# Patient Record
Sex: Male | Born: 1945 | Race: Black or African American | Hispanic: No | Marital: Single | Smoking: Former smoker
Health system: Southern US, Community
[De-identification: ages and names within clinical notes are randomized; demographics above are authoritative.]

## PROBLEM LIST (undated history)

## (undated) DIAGNOSIS — H3320 Serous retinal detachment, unspecified eye: Secondary | ICD-10-CM

## (undated) DIAGNOSIS — N289 Disorder of kidney and ureter, unspecified: Secondary | ICD-10-CM

## (undated) DIAGNOSIS — E119 Type 2 diabetes mellitus without complications: Secondary | ICD-10-CM

## (undated) HISTORY — PX: FOOT AMPUTATION: SHX951

---

## 2013-09-29 ENCOUNTER — Non-Acute Institutional Stay (SKILLED_NURSING_FACILITY): Payer: PRIVATE HEALTH INSURANCE | Admitting: Family

## 2013-09-29 DIAGNOSIS — IMO0002 Reserved for concepts with insufficient information to code with codable children: Secondary | ICD-10-CM

## 2013-09-29 DIAGNOSIS — E1165 Type 2 diabetes mellitus with hyperglycemia: Secondary | ICD-10-CM

## 2013-09-29 DIAGNOSIS — N186 End stage renal disease: Secondary | ICD-10-CM

## 2013-09-29 DIAGNOSIS — E118 Type 2 diabetes mellitus with unspecified complications: Secondary | ICD-10-CM

## 2013-09-29 DIAGNOSIS — Z992 Dependence on renal dialysis: Principal | ICD-10-CM

## 2013-09-29 DIAGNOSIS — M109 Gout, unspecified: Secondary | ICD-10-CM

## 2013-10-03 ENCOUNTER — Emergency Department (HOSPITAL_COMMUNITY): Payer: Medicare Other

## 2013-10-03 ENCOUNTER — Encounter (HOSPITAL_COMMUNITY): Payer: Self-pay | Admitting: Emergency Medicine

## 2013-10-03 ENCOUNTER — Emergency Department (HOSPITAL_COMMUNITY)
Admission: EM | Admit: 2013-10-03 | Discharge: 2013-10-03 | Disposition: A | Discharge status: Skilled Nursing Facility | Payer: Medicare Other | Attending: Emergency Medicine | Admitting: Emergency Medicine

## 2013-10-03 DIAGNOSIS — Y92129 Unspecified place in nursing home as the place of occurrence of the external cause: Secondary | ICD-10-CM

## 2013-10-03 DIAGNOSIS — N186 End stage renal disease: Secondary | ICD-10-CM | POA: Diagnosis not present

## 2013-10-03 DIAGNOSIS — W06XXXA Fall from bed, initial encounter: Secondary | ICD-10-CM | POA: Insufficient documentation

## 2013-10-03 DIAGNOSIS — S98919A Complete traumatic amputation of unspecified foot, level unspecified, initial encounter: Secondary | ICD-10-CM | POA: Diagnosis not present

## 2013-10-03 DIAGNOSIS — Z87891 Personal history of nicotine dependence: Secondary | ICD-10-CM | POA: Insufficient documentation

## 2013-10-03 DIAGNOSIS — W19XXXA Unspecified fall, initial encounter: Secondary | ICD-10-CM

## 2013-10-03 DIAGNOSIS — I959 Hypotension, unspecified: Secondary | ICD-10-CM | POA: Diagnosis not present

## 2013-10-03 DIAGNOSIS — S0990XA Unspecified injury of head, initial encounter: Secondary | ICD-10-CM | POA: Insufficient documentation

## 2013-10-03 DIAGNOSIS — Z880 Allergy status to penicillin: Secondary | ICD-10-CM | POA: Insufficient documentation

## 2013-10-03 DIAGNOSIS — Z7901 Long term (current) use of anticoagulants: Secondary | ICD-10-CM | POA: Diagnosis not present

## 2013-10-03 DIAGNOSIS — Y9389 Activity, other specified: Secondary | ICD-10-CM | POA: Insufficient documentation

## 2013-10-03 DIAGNOSIS — Y9210 Unspecified residential institution as the place of occurrence of the external cause: Secondary | ICD-10-CM | POA: Insufficient documentation

## 2013-10-03 DIAGNOSIS — E119 Type 2 diabetes mellitus without complications: Secondary | ICD-10-CM | POA: Diagnosis not present

## 2013-10-03 HISTORY — DX: Disorder of kidney and ureter, unspecified: N28.9

## 2013-10-03 HISTORY — DX: Serous retinal detachment, unspecified eye: H33.20

## 2013-10-03 HISTORY — DX: Type 2 diabetes mellitus without complications: E11.9

## 2013-10-03 LAB — BASIC METABOLIC PANEL
BUN: 59 mg/dL — ABNORMAL HIGH (ref 6–23)
CO2: 24 meq/L (ref 19–32)
CREATININE: 11.01 mg/dL — AB (ref 0.50–1.35)
Calcium: 7.3 mg/dL — ABNORMAL LOW (ref 8.4–10.5)
Chloride: 95 mEq/L — ABNORMAL LOW (ref 96–112)
GFR calc Af Amer: 5 mL/min — ABNORMAL LOW (ref >90)
GFR, EST NON AFRICAN AMERICAN: 4 mL/min — AB (ref >90)
GLUCOSE: 157 mg/dL — AB (ref 70–99)
Potassium: 3.4 mEq/L — ABNORMAL LOW (ref 3.7–5.3)
Sodium: 141 mEq/L (ref 137–147)

## 2013-10-03 LAB — CBC WITH DIFFERENTIAL/PLATELET
BASOS ABS: 0 10*3/uL (ref 0.0–0.1)
Basophils Relative: 0 % (ref 0–1)
EOS ABS: 0 10*3/uL (ref 0.0–0.7)
EOS PCT: 0 % (ref 0–5)
HEMATOCRIT: 24.8 % — AB (ref 39.0–52.0)
Hemoglobin: 8.2 g/dL — ABNORMAL LOW (ref 13.0–17.0)
LYMPHS PCT: 14 % (ref 12–46)
Lymphs Abs: 1.2 10*3/uL (ref 0.7–4.0)
MCH: 30.6 pg (ref 26.0–34.0)
MCHC: 33.1 g/dL (ref 30.0–36.0)
MCV: 92.5 fL (ref 78.0–100.0)
MONO ABS: 0.7 10*3/uL (ref 0.1–1.0)
Monocytes Relative: 8 % (ref 3–12)
Neutro Abs: 6.6 10*3/uL (ref 1.7–7.7)
Neutrophils Relative %: 78 % — ABNORMAL HIGH (ref 43–77)
Platelets: 320 10*3/uL (ref 150–400)
RBC: 2.68 MIL/uL — ABNORMAL LOW (ref 4.22–5.81)
RDW: 15.3 % (ref 11.5–15.5)
WBC: 8.5 10*3/uL (ref 4.0–10.5)

## 2013-10-03 LAB — PROTIME-INR
INR: 2.83 — ABNORMAL HIGH (ref 0.00–1.49)
Prothrombin Time: 28.8 seconds — ABNORMAL HIGH (ref 11.6–15.2)

## 2013-10-03 LAB — CG4 I-STAT (LACTIC ACID): LACTIC ACID, VENOUS: 1.05 mmol/L (ref 0.5–2.2)

## 2013-10-03 LAB — TROPONIN I

## 2013-10-03 MED ORDER — SODIUM CHLORIDE 0.9 % IV SOLN: 1000.0000 mL | INTRAVENOUS | Status: DC

## 2013-10-03 MED ORDER — SODIUM CHLORIDE 0.9 % IV SOLN
1000.0000 mL | Freq: Once | INTRAVENOUS | Status: AC
  Administered 2013-10-03: 1000 mL via INTRAVENOUS

## 2013-10-03 NOTE — ED Notes (Signed)
[facility] transport called for pt transport.

## 2013-10-03 NOTE — ED Notes (Signed)
Per [facility], pt was [facility] when he tried to get up to move the head of his bed up when he fell approximately 1' on to a tile floor and hit his head.  Pt c/o of headache and chronic Left foot pain (boot placed prior to ED visit).  Pt denies N/V/D, mild dizziness.  Pt shows no visible abrasions to the head from fall.  EMS vitals 96/60, 88 pulse, 16 respirations and 174 CBG.  Pt is on Coumadin.

## 2013-10-03 NOTE — ED Notes (Signed)
PT ambulated with baseline gait; VSS; A&Ox3; no signs of distress; respirations even and unlabored; skin warm and dry; no questions upon discharge.  

## 2013-10-03 NOTE — Discharge Instructions (Signed)
Because you were taking warfarin, you need to be watched for signs of delayed bleeding. If any neurologic problems occur, you need to return to the ED immediately for repeat CT scan.  Fall Prevention and Home Safety Falls cause injuries and can affect all age groups. It is possible to use preventive measures to significantly decrease the likelihood of falls. There are many simple measures which can make your home safer and prevent falls. OUTDOORS  Repair cracks and edges of walkways and driveways.  Remove high doorway thresholds.  Trim shrubbery on the main path into your home.  Have good outside lighting.  Clear walkways of tools, rocks, debris, and clutter.  Check that handrails are not broken and are securely fastened. Both sides of steps should have handrails.  Have leaves, snow, and ice cleared regularly.  Use sand or salt on walkways during winter months.  In the garage, clean up grease or oil spills. BATHROOM  Install night lights.  Install grab bars by the toilet and in the tub and shower.  Use non-skid mats or decals in the tub or shower.  Place a plastic non-slip stool in the shower to sit on, if needed.  Keep floors dry and clean up all water on the floor immediately.  Remove soap buildup in the tub or shower on a regular basis.  Secure bath mats with non-slip, double-sided rug tape.  Remove throw rugs and tripping hazards from the floors. BEDROOMS  Install night lights.  Make sure a bedside light is easy to reach.  Do not use oversized bedding.  Keep a telephone by your bedside.  Have a firm chair with side arms to use for getting dressed.  Remove throw rugs and tripping hazards from the floor. KITCHEN  Keep handles on pots and pans turned toward the center of the stove. Use back burners when possible.  Clean up spills quickly and allow time for drying.  Avoid walking on wet floors.  Avoid hot utensils and knives.  Position shelves so they  are not too high or low.  Place commonly used objects within easy reach.  If necessary, use a sturdy step stool with a grab bar when reaching.  Keep electrical cables out of the way.  Do not use floor polish or wax that makes floors slippery. If you must use wax, use non-skid floor wax.  Remove throw rugs and tripping hazards from the floor. STAIRWAYS  Never leave objects on stairs.  Place handrails on both sides of stairways and use them. Fix any loose handrails. Make sure handrails on both sides of the stairways are as long as the stairs.  Check carpeting to make sure it is firmly attached along stairs. Make repairs to worn or loose carpet promptly.  Avoid placing throw rugs at the top or bottom of stairways, or properly secure the rug with carpet tape to prevent slippage. Get rid of throw rugs, if possible.  Have an electrician put in a light switch at the top and bottom of the stairs. OTHER FALL PREVENTION TIPS  Wear low-heel or rubber-soled shoes that are supportive and fit well. Wear closed toe shoes.  When using a stepladder, make sure it is fully opened and both spreaders are firmly locked. Do not climb a closed stepladder.  Add color or contrast paint or tape to grab bars and handrails in your home. Place contrasting color strips on first and last steps.  Learn and use mobility aids as needed. Install an electrical emergency response system.  Turn on lights to avoid dark areas. Replace light bulbs that burn out immediately. Get light switches that glow.  Arrange furniture to create clear pathways. Keep furniture in the same place.  Firmly attach carpet with non-skid or double-sided tape.  Eliminate uneven floor surfaces.  Select a carpet pattern that does not visually hide the edge of steps.  Be aware of all pets. OTHER HOME SAFETY TIPS  Set the water temperature for 120 F (48.8 C).  Keep emergency numbers on or near the telephone.  Keep smoke detectors on  every level of the home and near sleeping areas. Document Released: 09/01/2002 Document Revised: 03/12/2012 Document Reviewed: 12/01/2011 [facility] Patient Information 2014 [place], [place].

## 2013-10-03 NOTE — ED Notes (Signed)
CT paged, pt is ready for transport.

## 2013-10-03 NOTE — ED Notes (Signed)
Pt's brief changed before departure. Report given to [facility].

## 2013-10-03 NOTE — ED Notes (Signed)
Phlebotomy at bedside drawing labs.

## 2013-10-03 NOTE — ED Notes (Signed)
[facility]; given to BerkleyJennifer. No questions.

## 2013-10-03 NOTE — ED Notes (Signed)
Pt has an amputation on the Right foot to the ankle.  Pt has a boot on his Left foot, pt states "waiting for autoamputation of his toe".  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  Right pupil is a 2 with brisk response.

## 2013-10-03 NOTE — ED Provider Notes (Signed)
CSN: [id number]     Arrival date & time 10/03/13  0415 History   First MD Initiated Contact with Patient 10/03/13 0435     Chief Complaint  Patient presents with  . Fall  . Headache   (Consider location/radiation/quality/duration/timing/severity/associated sxs/prior Treatment) Patient is a 68 y.o. male presenting with fall and headaches. The history is provided by the EMS personnel, the nursing home and the patient.  Fall Associated symptoms include headaches.  Headache He states that he was leaning over to adjust the head in his bed, and the next thing he knew he was on the floor. He nursing home relates that he has had periods complaining of pain in the frontal area. Pain is moderate and he rates it at 4/10. He denies loss of consciousness. There's been no nausea or vomiting. He is on warfarin.  Past Medical History  Diagnosis Date  . Detached retina     Left eye  . Renal disorder   . Diabetes mellitus without complication    Past Surgical History  Procedure Laterality Date  . Foot amputation      Right Foot   History reviewed. No pertinent family history. History  Substance Use Topics  . Smoking status: Former Games developer  . Smokeless tobacco: Never Used  . Alcohol Use: No    Review of Systems  Neurological: Positive for headaches.  All other systems reviewed and are negative.    Allergies  Penicillins  Home Medications  No current outpatient prescriptions on file. BP 100/55  Pulse 81  Temp(Src) 98.2 F (36.8 C) (Oral)  Ht 6' (1.829 m)  Wt 173 lb (78.472 kg)  BMI 23.46 kg/m2  SpO2 95% Physical Exam  Nursing note and vitals reviewed.  68 year old male, resting comfortably and in no acute distress. Vital signs are normal. Oxygen saturation is 95%, which is normal. Head is normocephalic and atraumatic. Right pupil responds to light, left pupil does not. EOMI. Oropharynx is clear. Neck is nontender and supple without adenopathy or JVD. Back is nontender and there  is no CVA tenderness. Lungs are clear without rales, wheezes, or rhonchi. Chest is nontender. Heart has regular rate and rhythm without murmur. Abdomen is soft, flat, nontender without masses or hepatosplenomegaly and peristalsis is normoactive. Extremities have no cyanosis or edema. 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 Left foot is in a boot and is not examined. Skin is warm and dry without rash. Neurologic: Mental status is normal, cranial nerves are intact, there are no motor or sensory deficits.  ED Course  Procedures (including critical care time) Labs Review Results for orders placed during the hospital encounter of 10/03/13  PROTIME-INR      Result Value Range   Prothrombin Time 28.8 (*) 11.6 - 15.2 seconds   INR 2.83 (*) 0.00 - 1.49  CBC WITH DIFFERENTIAL      Result Value Range   WBC 8.5  4.0 - 10.5 K/uL   RBC 2.68 (*) 4.22 - 5.81 MIL/uL   Hemoglobin 8.2 (*) 13.0 - 17.0 g/dL   HCT 78.2 (*) 95.6 - 21.3 %   MCV 92.5  78.0 - 100.0 fL   MCH 30.6  26.0 - 34.0 pg   MCHC 33.1  30.0 - 36.0 g/dL   RDW 08.6  57.8 - 46.9 %   Platelets 320  150 - 400 K/uL   Neutrophils Relative % 78 (*) 43 - 77 %   Neutro Abs 6.6  1.7 - 7.7 K/uL   Lymphocytes Relative  14  12 - 46 %   Lymphs Abs 1.2  0.7 - 4.0 K/uL   Monocytes Relative 8  3 - 12 %   Monocytes Absolute 0.7  0.1 - 1.0 K/uL   Eosinophils Relative 0  0 - 5 %   Eosinophils Absolute 0.0  0.0 - 0.7 K/uL   Basophils Relative 0  0 - 1 %   Basophils Absolute 0.0  0.0 - 0.1 K/uL  BASIC METABOLIC PANEL      Result Value Range   Sodium 141  137 - 147 mEq/L   Potassium 3.4 (*) 3.7 - 5.3 mEq/L   Chloride 95 (*) 96 - 112 mEq/L   CO2 24  19 - 32 mEq/L   Glucose, Bld 157 (*) 70 - 99 mg/dL   BUN 59 (*) 6 - 23 mg/dL   Creatinine, Ser 16.1011.01 (*) 0.50 - 1.35 mg/dL   Calcium 7.3 (*) 8.4 - 10.5 mg/dL   GFR calc non Af Amer 4 (*) >90 mL/min   GFR calc Af Amer 5 (*) >90 mL/min  TROPONIN I      Result Value Range   Troponin I <0.30  <0.30  ng/mL  CG4 I-STAT (LACTIC ACID)      Result Value Range   Lactic Acid, Venous 1.05  0.5 - 2.2 mmol/L   Imaging Review Ct Head Wo Contrast  10/03/2013   CLINICAL DATA:  Fall with headache  EXAM: CT HEAD WITHOUT CONTRAST  CT CERVICAL SPINE WITHOUT CONTRAST  TECHNIQUE: Multidetector CT imaging of the head and cervical spine was performed following the standard protocol without intravenous contrast. Multiplanar CT image reconstructions of the cervical spine were also generated.  COMPARISON:  None.  FINDINGS: CT HEAD FINDINGS  Skull and Sinuses:No significant abnormality.  Orbits: High attenuation centrally within the left globe, most compatible with silicone injection in this patient with history of retinal detachment.  Brain: No evidence of acute abnormality, such as acute infarction, hemorrhage, hydrocephalus, or mass lesion/mass effect. There is dystrophic mineralization in the globus pallidum. Linear mineralization, likely vascular, in the left centrum semiovale. These dystrophic calcifications are fairly extensive, and when considering the extensive extracranial arterial calcification, findings are likely related to the patient's reported renal disease. Cerebral volume loss.  CT CERVICAL SPINE FINDINGS  Status post C4-C7 anterior cervical discectomy and fusion. There is also corpectomy at C6. There is complete interbody fusion. There is no adverse features related to the ventral plate and screw. There is been a posterior rod and lateral mass/pedicle screw fixation from C3-T1. No screws placed at C7. Decompressive laminectomies were performed at C2-3 to C6-7.  No evidence of acute fracture. No hardware fracture. No prevertebral edema or gross cervical canal hematoma. Evaluation of the cervical canal is especially limited in the setting of hardware artifact. 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  15 mm long nodule in the right tracheoesophageal groove, contacting but not definitively arising from the right thyroid gland. There is enlargement of the lower right thyroid, likely by a sub cm nodule.  IMPRESSION: 1. No evidence of acute intracranial or cervical spine injury. 2. Silicone tamponade in the left eye. Please confirm with surgical history. 3. Extensive cervical surgery, with anterior discectomies/corpectomy and posterior decompressive laminectomies. 4. 15 mm nodule in the right tracheoesophageal groove which could be parathyroid mass or hyperplasia. Recommend outpatient laboratory assessment of  calcium metabolism.   Electronically Signed   By: Tiburcio Pea M.D.   On: 10/03/2013 05:52   Ct Cervical Spine Wo Contrast  10/03/2013   CLINICAL DATA:  Fall with headache  EXAM: CT HEAD WITHOUT CONTRAST  CT CERVICAL SPINE WITHOUT CONTRAST  TECHNIQUE: Multidetector CT imaging of the head and cervical spine was performed following the standard protocol without intravenous contrast. Multiplanar CT image reconstructions of the cervical spine were also generated.  COMPARISON:  None.  FINDINGS: CT HEAD FINDINGS  Skull and Sinuses:No significant abnormality.  Orbits: High attenuation centrally within the left globe, most compatible with silicone injection in this patient with history of retinal detachment.  Brain: No evidence of acute abnormality, such as acute infarction, hemorrhage, hydrocephalus, or mass lesion/mass effect. There is dystrophic mineralization in the globus pallidum. Linear mineralization, likely vascular, in the left centrum semiovale. These dystrophic calcifications are fairly extensive, and when considering the extensive extracranial arterial calcification, findings are likely related to the patient's reported renal disease. Cerebral volume loss.  CT CERVICAL SPINE FINDINGS  Status post C4-C7 anterior cervical discectomy and  fusion. There is also corpectomy at C6. There is complete interbody fusion. There is no adverse features related to the ventral plate and screw. There is been a posterior rod and lateral mass/pedicle screw fixation from C3-T1. No screws placed at C7. Decompressive laminectomies were performed at C2-3 to C6-7.  No evidence of acute fracture. No hardware fracture. No prevertebral edema or gross cervical canal hematoma. Evaluation of the cervical canal is especially limited in the setting of hardware artifact. 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  15 mm long nodule in the right tracheoesophageal groove, contacting but not definitively arising from the right thyroid gland. There is enlargement of the lower right thyroid, likely by a sub cm nodule.  IMPRESSION: 1. No evidence of acute intracranial or cervical spine injury. 2. Silicone tamponade in the left eye. Please confirm with surgical history. 3. Extensive cervical surgery, with anterior discectomies/corpectomy and posterior decompressive laminectomies. 4. 15 mm nodule in the right tracheoesophageal groove which could be parathyroid mass or hyperplasia. Recommend outpatient laboratory assessment of calcium metabolism.   Electronically Signed   By: Tiburcio Pea M.D.   On: 10/03/2013 05:52    EKG Interpretation    Date/Time:  Friday October 03 2013 06:34:44 EST Ventricular Rate:  90 PR Interval:  147 QRS Duration: 109 QT Interval:  436 QTC Calculation: 533 R Axis:   -64 Text Interpretation:  Sinus rhythm Left anterior fascicular block Abnormal R-wave progression, late transition Prolonged QT interval No old tracing to compare Confirmed by [facility]  MD, Qamar Aughenbaugh ([id number]) on 10/03/2013 6:49:22 AM            MDM   1. Fall at nursing home, initial encounter   2. ESRD (end  stage renal disease)   3. Hypotension    Fall with head injury in patient on warfarin. INR will be checked as well CT of head and cervical spine  5:50 AM The patient's blood pressure had dropped. He was observed since he was in no distress but repeat systolic blood pressure is 79. He is given IV fluids and additional lab work is ordered as well as ECG.  Workup is unremarkable. He shows evidence of end stage renal disease lactic acid level was normal. Blood pressure came up with IV  fluids and he is discharged to return to his nursing home.  Dione Booze, MD 10/03/13 [phone]

## 2013-10-09 ENCOUNTER — Non-Acute Institutional Stay (SKILLED_NURSING_FACILITY): Payer: PRIVATE HEALTH INSURANCE | Admitting: Internal Medicine

## 2013-10-09 DIAGNOSIS — I96 Gangrene, not elsewhere classified: Secondary | ICD-10-CM

## 2013-10-09 DIAGNOSIS — M79609 Pain in unspecified limb: Principal | ICD-10-CM

## 2013-10-09 DIAGNOSIS — T8789 Other complications of amputation stump: Secondary | ICD-10-CM

## 2013-10-10 ENCOUNTER — Encounter: Payer: Self-pay | Admitting: *Deleted

## 2013-10-11 DIAGNOSIS — M79609 Pain in unspecified limb: Principal | ICD-10-CM

## 2013-10-11 DIAGNOSIS — T8789 Other complications of amputation stump: Secondary | ICD-10-CM | POA: Insufficient documentation

## 2013-10-11 DIAGNOSIS — I96 Gangrene, not elsewhere classified: Secondary | ICD-10-CM | POA: Insufficient documentation

## 2013-10-11 NOTE — Progress Notes (Signed)
         PROGRESS NOTE  DATE: 10/09/2013  FACILITY:  [facility]  LEVEL OF CARE: SNF (31)  Acute Visit  CHIEF COMPLAINT:  Manage stump pain  HISTORY OF PRESENT ILLNESS: I was requested by the staff to assess the patient regarding above problem(s):  CHRONIC PAIN: The patient's chronic pain remains stable.  Complications are reported from the medications presently being used.  Patient denies ongoing pain. Patient's responsible party feels that the oxycodone is too much for him. Patient is unable to think or concentrate. RP would A less stronger pain medication.  PAST MEDICAL HISTORY : Reviewed.  No changes.  [facility]  REVIEW OF SYSTEMS:  GENERAL: no change in appetite, no fatigue, no weight changes, no fever, chills or weakness RESPIRATORY: no cough, SOB, DOE,, wheezing, hemoptysis CARDIAC: no chest pain, edema or palpitations GI: no abdominal pain, diarrhea, constipation, heart burn, nausea or vomiting  PHYSICAL EXAMINATION  GENERAL: no acute distress, normal body habitus RESPIRATORY: breathing is even & unlabored, BS CTAB CARDIAC: RRR, no murmur,no extra heart sounds, no edema GI: abdomen soft, normal BS, no masses, no tenderness, no hepatomegaly, no splenomegaly PSYCHIATRIC: the patient is alert & oriented to person, affect & behavior appropriate  ASSESSMENT/PLAN:  Stump pain-discontinue oxycodone due to side effects. 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 but patient denies to go to the [facility] for a vascular surgery consultation.  CPT CODE: [id number]

## 2013-10-14 NOTE — Progress Notes (Signed)
Patient ID: Raymond Franklin, male   DOB: 1946/06/05, 68 y.o.   MRN: [id number]      Date: 09/29/13  Facility: [facility]  Code Status:  Full  Chief Complaint   Patient presents with   .  Hospitalization Follow-up   HPI: Pt is admitted to facility for short-term rehabilitation. Pt has an extensive LLE wound which is being treated with minocycline and dsg changes to prevent osteomyelitis. Pt reports refusing medical recommendation of amputation at present. Pt reports pain is being mitigated with prn medication.  Allergies   Allergen  Reactions   .  Fosrenol [Lanthanum]    .  Penicillins       Medication List         This list is accurate as of: 09/29/13 7:30 PM. Always use your most recent med list.            allopurinol 100 MG tablet    Commonly known as: ZYLOPRIM    Take 100 mg by mouth daily.    cholecalciferol 400 UNITS Tabs tablet    Commonly known as: VITAMIN D    Take 1,000 Units by mouth.    cinacalcet 60 MG tablet    Commonly known as: SENSIPAR    Take 60 mg by mouth daily.    docusate calcium 240 MG capsule    Commonly known as: SURFAK    Take 240 mg by mouth daily.    folic acid-vitamin b complex-vitamin c-selenium-zinc 3 MG Tabs tablet    Take 1 tablet by mouth daily.    gabapentin 100 MG capsule    Commonly known as: NEURONTIN    Take 100 mg by mouth 2 (two) times daily.    insulin aspart 100 UNIT/ML injection    Commonly known as: novoLOG    Inject into the skin 3 (three) times daily before meals.    insulin detemir 100 UNIT/ML injection    Commonly known as: LEVEMIR    Inject 5 Units into the skin at bedtime.    minocycline 100 MG capsule    Commonly known as: MINOCIN,DYNACIN    Take 100 mg by mouth 2 (two) times daily. Osteomyelitis prophylaxis    omeprazole 40 MG capsule    Commonly known as: PRILOSEC    Take 40 mg by mouth daily.    oxycodone 5 MG capsule    Commonly known as: OXY-IR    Take 5 mg by mouth every 6 (six) hours as needed. Take 0.5  tablet for moderate pain, one tablet for severe pain    warfarin 5 MG tablet    Commonly known as: COUMADIN    Take 5 mg by mouth daily.     DATA REVIEWED  Laboratory Studies: Reviewed  Past Medical History   Diagnosis  Date   .  Gout    .  Diabetes mellitus without complication    .  Chronic kidney disease      Dialysis T-R-S   .  Neuromuscular disorder    .  Wound, open, foot with complication    Review of Systems  Constitutional: Negative.  HENT: Negative.  Eyes: Negative.  Respiratory: Negative.  Cardiovascular: Negative.  Genitourinary:  ESRD-anuric  Musculoskeletal:  DME-wheelchair  Skin:  Wound to LLE-gangrene  Neurological: Negative.  Endo/Heme/Allergies:  DM  Psychiatric/Behavioral: Negative.  Physical Exam  Filed Vitals:    09/29/13 1915   BP:  140/80   Pulse:  78   Temp:  98.5 F (36.9 C)  Resp:  18   There is no height or weight on file to calculate BMI.  Physical Exam  Constitutional: He is oriented to person, place, and time.  Cardiovascular: Normal rate and regular rhythm.  Pulmonary/Chest: Effort normal and breath sounds normal.  Genitourinary:  Dialysis Access +/+ RUE  Neurological: He is alert and oriented to person, place, and time.  Skin: Skin is warm and dry.  Psychiatric: He has a normal mood and affect. His behavior is normal. Judgment and thought content normal.  ASSESSMENT/PLAN  ESRD-will continue dialysis T-R-S; pt is stable  Gout-will continue Allopurinol; pt is stable  Osteomyelitis- will continue minocycline and prn Oxycodone in addition to wound care treatment  DM-will continue Levemir and Novolog  Follow up:prn

## 2013-10-28 ENCOUNTER — Non-Acute Institutional Stay (SKILLED_NURSING_FACILITY): Payer: PRIVATE HEALTH INSURANCE | Admitting: Internal Medicine

## 2013-10-28 DIAGNOSIS — T8189XA Other complications of procedures, not elsewhere classified, initial encounter: Secondary | ICD-10-CM

## 2013-10-28 NOTE — Progress Notes (Signed)
Patient ID: Raymond GlassmanWilliam Franklin, male   DOB: 04/08/1946, 68 y.o.   MRN: [id number] [facility] Chief complaint; review of left transmetatarsal amputation site Date of service; 10/24/2013 History; this is a patient who came into the facility earlier this month. Since he dialyzes in the afternoon I've had trouble visiting him. The information that comes from the [facility] is not really that indicative if his overall health status. He is being listed as having "tetraplegia". Although the exact diagnosis here remains unclear. Apparently he came in with a gangrenous toe this has since amputated and he has gone on to a transmetatarsal amputation came back with a wound VAC order. He is on Vantin and Zyvox is something I did not recognize and called in an order for doxycycline which is clearly not indicated. A culture I did of the wound 4 days ago is negative  When I saw this site the had been some dehiscent site. The base of the dehisced area did not look healthy. The entire area of surrounding skin also looked somewhat unhealthy I therefore discontinued the wound VAC and substituted silver alginate. Apparently per the wound care staff the area actually looks a lot better but once again today he is at dialysis  Physical examination Left foot the area had a dehiscent site. The base of this did not look healthy nor did the surrounding skin which looked macerated and of questionable viability. The entire foot was somewhat swollen and tender   impression/plan #1 surgical wound nonhealing with a dehiscence. I didn't think any of this looked. Healthy with a wound VAC on therefore I changed that to. I think he is supposed to followup with his surgeon the [facility]. We'll see if they have any further suggestions #2 I was worried about a surgical site infection. Actually tried to call in antibiotics 2 through dialysis although they did not return my call. He is on both Vantin [third-generation cephalosporin] and Zyvox which will  not complete for another two-weeks #3 on Coumadin again for reasons that are not totally clear although I have not had any luck in getting to see the patient and go over his history with him. #4 type 2 diabetes on insulin #5 because of this gentleman's this ability isn't obvious from her review of the records that came from him. He'll see if I can talk to the patient at length what I'm here in a few days and hopefully he is not out of the building and see if I can complete the record

## 2015-04-13 IMAGING — CT CT CERVICAL SPINE W/O CM
4 of 6 series · 13 of 33 positions shown, 15 images · non-contrast
Comparison: None.

CLINICAL DATA: Fall with headache

EXAM:
CT HEAD WITHOUT CONTRAST
CT CERVICAL SPINE WITHOUT CONTRAST
TECHNIQUE: Multidetector CT imaging of the head and cervical spine was
performed following the standard protocol without intravenous
contrast. Multiplanar CT image reconstructions of the cervical spine
were also generated.

[Series 5: soft tissue · axial · 0.39mm/px · z∈[+64,+152]mm · 3 of 89 slices shown]
[im 23/89  soft-tissue]
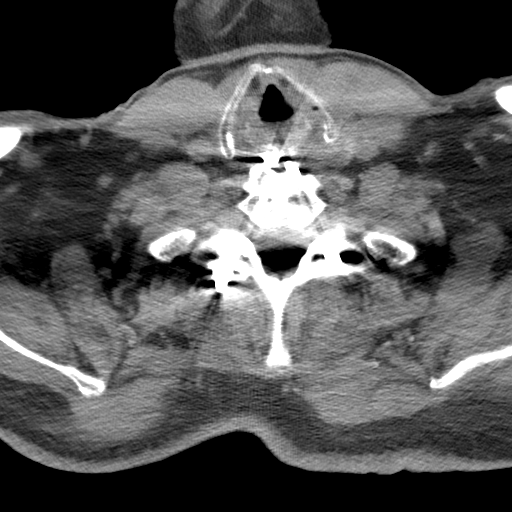
[im 45/89  soft-tissue]
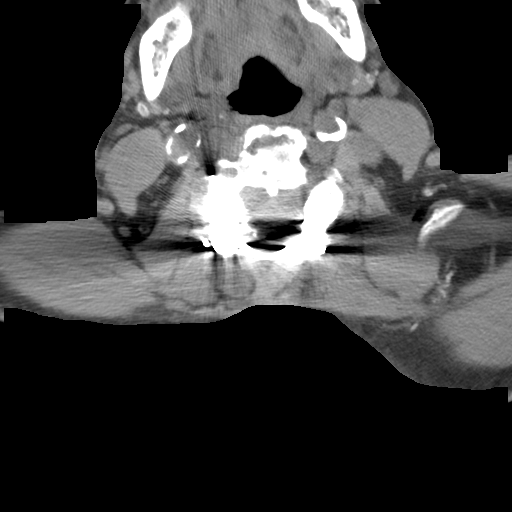
[im 67/89  soft-tissue]
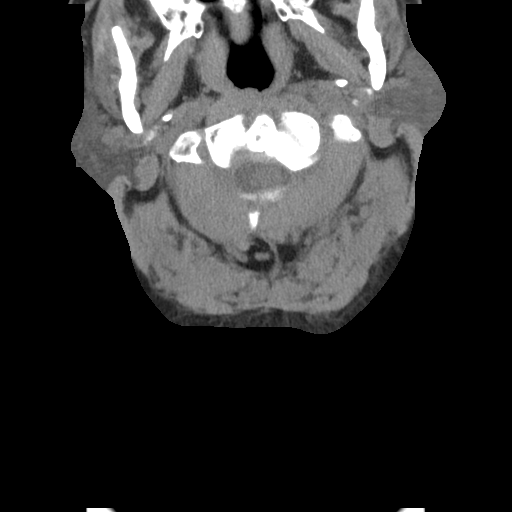

[orthog · axial · 0.39mm/px · z∈[+56,+105]mm · 2 of 78 slices shown, 3 images]
[im 26/78  soft-tissue]
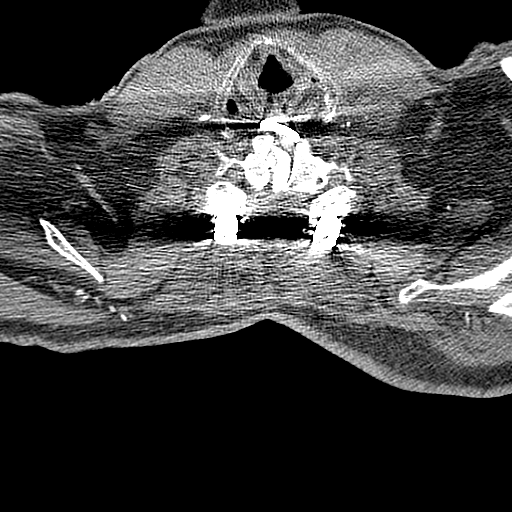
[im 26/78  bone]
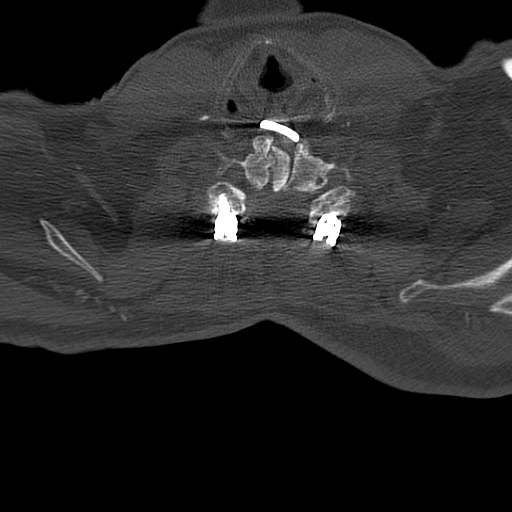
[im 52/78  bone]
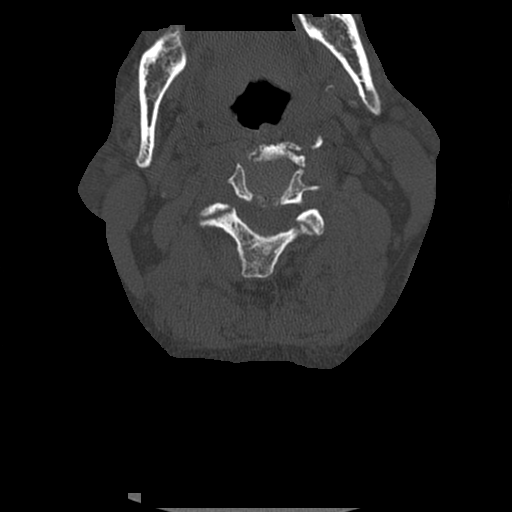

[coronal · coronal · 0.39mm/px · 3 of 57 slices shown]
[im 12/57  bone]
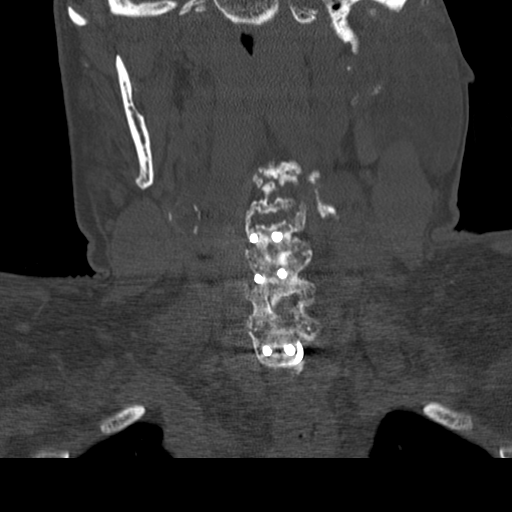
[im 23/57  bone]
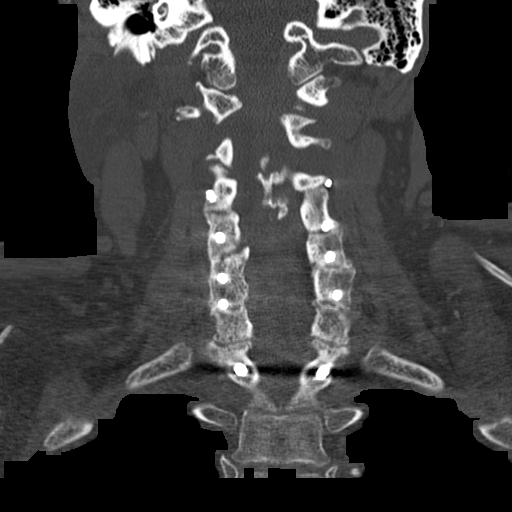
[im 34/57  bone]
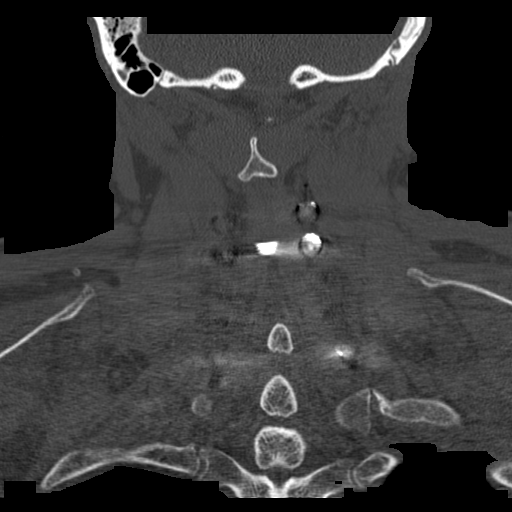

[sag · sagittal · 0.39mm/px · 5 of 64 slices shown, 6 images]
[im 22/64  bone]
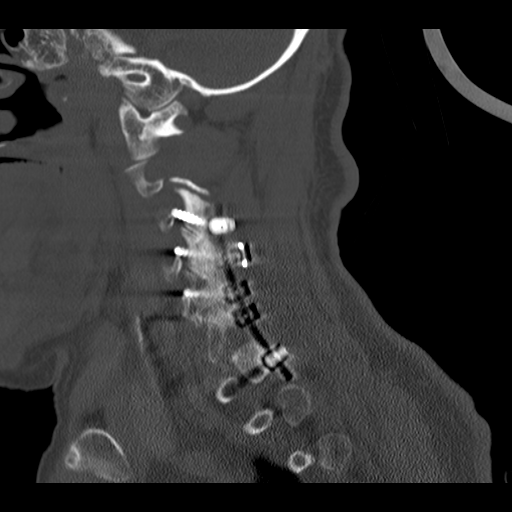
[im 27/64  bone]
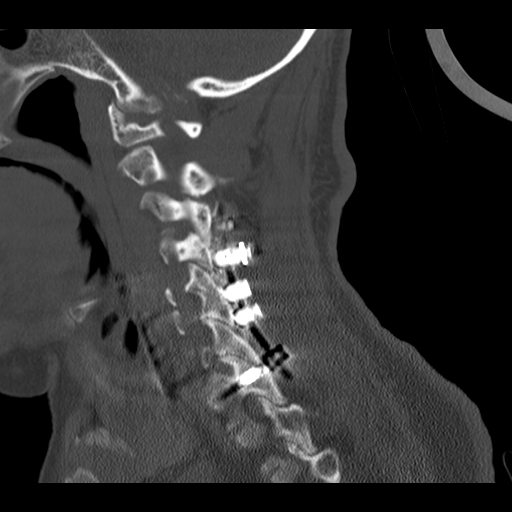
[im 32/64  soft-tissue]
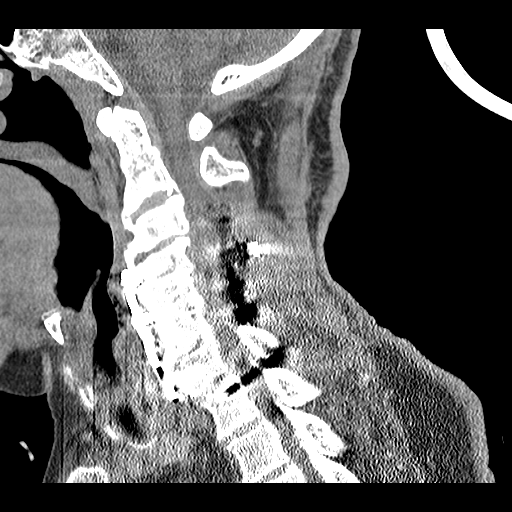
[im 32/64  bone]
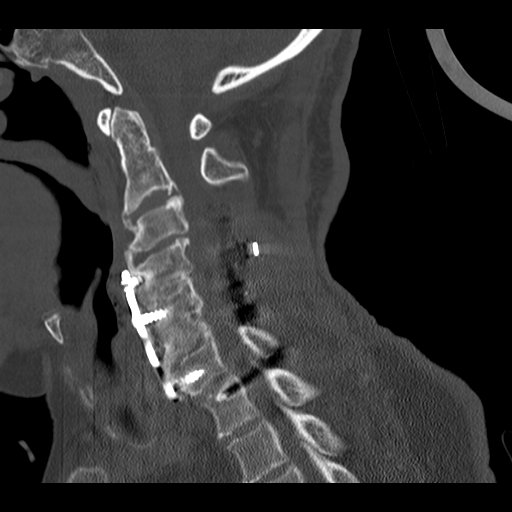
[im 37/64  bone]
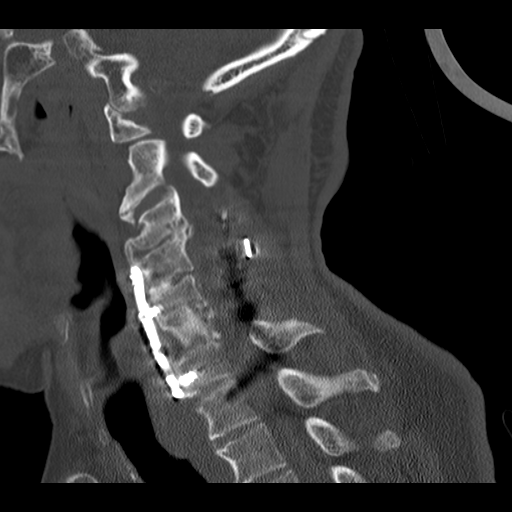
[im 43/64  bone]
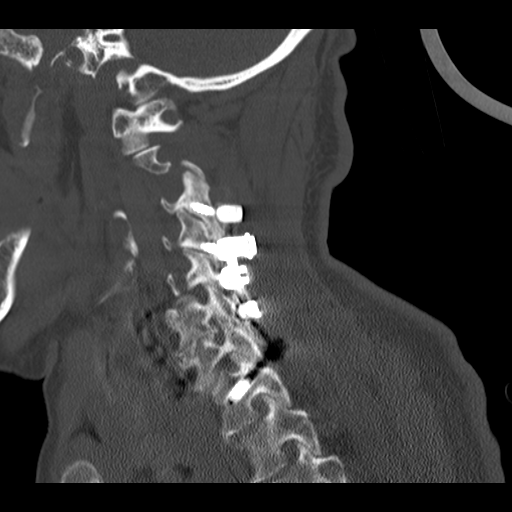

[13 of 33 positions shown; findings below may reference images not displayed]

FINDINGS: CT HEAD FINDINGS

Skull and Sinuses:No significant abnormality.

Orbits: High attenuation centrally within the left globe, most
compatible with silicone injection in this patient with history of
retinal detachment.

Brain: No evidence of acute abnormality, such as acute infarction,
hemorrhage, hydrocephalus, or mass lesion/mass effect. There is
dystrophic mineralization in the globus pallidum. Linear
mineralization, likely vascular, in the left centrum semiovale.
These dystrophic calcifications are fairly extensive, and when
considering the extensive extracranial arterial calcification,
findings are likely related to the patient's reported renal disease.
Cerebral volume loss.

CT CERVICAL SPINE FINDINGS

Status post C4-C7 anterior cervical discectomy and fusion. There is
also corpectomy at C6. There is complete interbody fusion. There is
no adverse features related to the ventral plate and screw. There is
been a posterior rod and lateral mass/pedicle screw fixation from
C3-T1. No screws placed at C7. Decompressive laminectomies were
performed at C2-3 to C6-7.

No evidence of acute fracture. No hardware fracture. No prevertebral
edema or gross cervical canal hematoma. Evaluation of the cervical
canal is especially limited in the setting of hardware artifact.
There is spinal canal stenosis at C2-3, congenital narrowing with
superimposed ligamentous thickening. Facet osteoarthritis at the
same level, especially on the right, accelerated by adjacent level
degenerative stresses.

15 mm long nodule in the right tracheoesophageal groove, contacting
but not definitively arising from the right thyroid gland. There is
enlargement of the lower right thyroid, likely by a sub cm nodule.
IMPRESSION: 1. No evidence of acute intracranial or cervical spine injury.
2. Silicone tamponade in the left eye. Please confirm with surgical
history.
3. Extensive cervical surgery, with anterior discectomies/corpectomy
and posterior decompressive laminectomies.
4. 15 mm nodule in the right tracheoesophageal groove which could be
parathyroid mass or hyperplasia. Recommend outpatient laboratory
assessment of calcium metabolism.

## 2017-09-25 DEATH — deceased
